# Patient Record
Sex: Female | Born: 1975 | Race: White | Hispanic: No | Marital: Married | Smoking: Former smoker
Health system: Southern US, Community
[De-identification: ages and names within clinical notes are randomized; demographics above are authoritative.]

## PROBLEM LIST (undated history)

## (undated) DIAGNOSIS — F419 Anxiety disorder, unspecified: Secondary | ICD-10-CM

## (undated) DIAGNOSIS — K219 Gastro-esophageal reflux disease without esophagitis: Secondary | ICD-10-CM

## (undated) DIAGNOSIS — F329 Major depressive disorder, single episode, unspecified: Secondary | ICD-10-CM

## (undated) DIAGNOSIS — N926 Irregular menstruation, unspecified: Secondary | ICD-10-CM

## (undated) DIAGNOSIS — R87629 Unspecified abnormal cytological findings in specimens from vagina: Secondary | ICD-10-CM

## (undated) DIAGNOSIS — K5900 Constipation, unspecified: Secondary | ICD-10-CM

## (undated) DIAGNOSIS — F32A Depression, unspecified: Secondary | ICD-10-CM

## (undated) HISTORY — DX: Unspecified abnormal cytological findings in specimens from vagina: R87.629

## (undated) HISTORY — DX: Anxiety disorder, unspecified: F41.9

## (undated) HISTORY — DX: Depression, unspecified: F32.A

## (undated) HISTORY — DX: Gastro-esophageal reflux disease without esophagitis: K21.9

## (undated) HISTORY — DX: Constipation, unspecified: K59.00

## (undated) HISTORY — DX: Irregular menstruation, unspecified: N92.6

## (undated) HISTORY — DX: Major depressive disorder, single episode, unspecified: F32.9

---

## 1998-07-02 HISTORY — PX: LEEP: SHX91

## 2014-01-07 LAB — HM PAP SMEAR: HM PAP: NEGATIVE

## 2015-03-17 ENCOUNTER — Telehealth: Payer: Self-pay | Admitting: Obstetrics and Gynecology

## 2015-03-17 NOTE — Telephone Encounter (Signed)
SHE IS ON LOR[place] THE BC AND SHE HAS SCHEDULED HER AE FOR NOV. CAN WE PLEASE CAL IN A RX FOR HER BC TO LAST UNTIL HER APPT WHICH IS 11/9, SHE HAS A PACK TO LAST HER UNTIL October SO SHE MIGHT WILL NEED ON OR MAYBE 2 REFILLS, [facility] [address]

## 2015-03-21 ENCOUNTER — Other Ambulatory Visit: Payer: Self-pay | Admitting: *Deleted

## 2015-03-21 MED ORDER — DROSPIRENONE-ETHINYL ESTRADIOL 3-0.02 MG PO TABS: 1.0000 | ORAL_TABLET | Freq: Every day | ORAL | Status: DC

## 2015-03-21 NOTE — Telephone Encounter (Signed)
Refilled x 2 pt is to keep appt that is scheduled

## 2015-04-21 ENCOUNTER — Encounter: Payer: Self-pay | Admitting: *Deleted

## 2015-05-04 ENCOUNTER — Other Ambulatory Visit: Payer: Self-pay | Admitting: *Deleted

## 2015-05-04 MED ORDER — DROSPIRENONE-ETHINYL ESTRADIOL 3-0.02 MG PO TABS: 1.0000 | ORAL_TABLET | Freq: Every day | ORAL | Status: AC

## 2015-05-11 ENCOUNTER — Encounter: Payer: Self-pay | Admitting: Obstetrics and Gynecology

## 2015-06-01 ENCOUNTER — Encounter: Payer: Self-pay | Admitting: Obstetrics and Gynecology

## 2015-06-01 ENCOUNTER — Ambulatory Visit (INDEPENDENT_AMBULATORY_CARE_PROVIDER_SITE_OTHER): Payer: Managed Care, Other (non HMO) | Admitting: Obstetrics and Gynecology

## 2015-06-01 ENCOUNTER — Other Ambulatory Visit: Payer: Self-pay | Admitting: Obstetrics and Gynecology

## 2015-06-01 VITALS — BP 134/85 | HR 96 | Ht 62.0 in | Wt 206.8 lb

## 2015-06-01 DIAGNOSIS — F529 Unspecified sexual dysfunction not due to a substance or known physiological condition: Secondary | ICD-10-CM

## 2015-06-01 DIAGNOSIS — F909 Attention-deficit hyperactivity disorder, unspecified type: Secondary | ICD-10-CM | POA: Diagnosis not present

## 2015-06-01 DIAGNOSIS — Z01419 Encounter for gynecological examination (general) (routine) without abnormal findings: Secondary | ICD-10-CM

## 2015-06-01 MED ORDER — ESCITALOPRAM OXALATE 20 MG PO TABS: 20.0000 mg | ORAL_TABLET | Freq: Every day | ORAL | Status: DC

## 2015-06-01 NOTE — Progress Notes (Signed)
Subjective:   Victoria MansonKerry Paule is a 39 y.o. 632P0 Caucasian female here for a routine well-woman exam.  Patient's last menstrual period was 05/06/2015 (exact date).    Current complaints: ADHD new diagnosis- doing well on Adderal, c/o absent sex drive and inability to achieve orgasm on Cymbalta,  PCP: Maryruth BunKapur       Does need & desire labs  Social History: Sexual: heterosexual Marital Status: married Living situation: with family Occupation: unknown occupation Tobacco/alcohol: no tobacco use Illicit drugs: no history of illicit drug use  The following portions of the patient's history were reviewed and updated as appropriate: allergies, current medications, past family history, past medical history, past social history, past surgical history and problem list.  Past Medical History Past Medical History  Diagnosis Date  . Anxiety   . Depression   . Vaginal Pap smear, abnormal   . Irregular menses   . Constipated     Past Surgical History Past Surgical History  Procedure Laterality Date  . Leep  2000    Gynecologic History G2P0  Patient's last menstrual period was 05/06/2015 (exact date). Contraception: OCP (estrogen/progesterone) Last Pap: 2014. Results were: normal  Obstetric History OB History  Gravida Para Term Preterm AB SAB TAB Ectopic Multiple Living  2         2    # Outcome Date GA Lbr Len/2nd Weight Sex Delivery Anes PTL Lv  2 Gravida 2013    M Vag-Spont   Y  1 Gravida 2012    M Vag-Spont   Y      Current Medications Current Outpatient Prescriptions on File Prior to Visit  Medication Sig Dispense Refill  . drospirenone-ethinyl estradiol (YAZ,GIANVI,LORYNA) 3-0.02 MG tablet Take 1 tablet by mouth daily. 3 Package 2   No current facility-administered medications on file prior to visit.    Review of Systems Patient denies any headaches, blurred vision, shortness of breath, chest pain, abdominal pain, problems with bowel movements, urination, or  intercourse.  Objective:  BP 134/85 mmHg  Pulse 96  Ht 5\' 2"  (1.575 m)  Wt 206 lb 12.8 oz (93.804 kg)  BMI 37.81 kg/m2  LMP 05/06/2015 (Exact Date) Physical Exam  General:  Well developed, well nourished, no acute distress. She is alert and oriented x3. Skin:  Warm and dry Neck:  Midline trachea, no thyromegaly or nodules Cardiovascular: Regular rate and rhythm, no murmur heard Lungs:  Effort normal, all lung fields clear to auscultation bilaterally Breasts:  No dominant palpable mass, retraction, or nipple discharge Abdomen:  Soft, non tender, no hepatosplenomegaly or masses Pelvic:  External genitalia is normal in appearance.  The vagina is normal in appearance. The cervix is bulbous, no CMT.  Thin prep pap is done with HR HPV cotesting. Uterus is felt to be normal size, shape, and contour.  No adnexal masses or tenderness noted. 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 F/U 1 year for AE, or sooner if needed Mammogram scheduled baseline  Treena Cosman Suzan NailerN Idil Maslanka, CNM

## 2015-06-01 NOTE — Patient Instructions (Addendum)
Place annual gynecologic exam patient instructions here.  Thank you for enrolling in MyChart. Please follow the instructions below to securely access your online medical record. MyChart allows you to send messages to your doctor, view your test results, manage appointments, and more.   How Do I Sign Up? 1. In your Internet browser, go to [employer] and enter [email or web address]. 2. Click on the Sign Up Now link in the Sign In box. You will see the New Member Sign Up page. 3. Enter your MyChart Access Code exactly as it appears below. You will not need to use this code after you've completed the sign-up process. If you do not sign up before the expiration date, you must request a new code.  MyChart Access Code: [email or web address] Expires: 07/31/2015  9:06 AM  4. Enter your Social Security Number ([id number]) and Date of Birth (mm/dd/yyyy) as indicated and click Submit. You will be taken to the next sign-up page. 5. Create a MyChart ID. This will be your MyChart login ID and cannot be changed, so think of one that is secure and easy to remember. 6. Create a MyChart password. You can change your password at any time. 7. Enter your Password Reset Question and Answer. This can be used at a later time if you forget your password.  8. Enter your e-mail address. You will receive e-mail notification when new information is available in MyChart. 9. Click Sign Up. You can now view your medical record.   Additional Information Remember, MyChart is NOT to be used for urgent needs. For medical emergencies, dial 911.   Levonorgestrel intrauterine device (IUD) What is this medicine? LEVONORGESTREL IUD (LEE voe nor jes trel) is a contraceptive (birth control) device. The device is placed inside the uterus by a healthcare professional. It is used to prevent pregnancy and can also be used to treat heavy bleeding that occurs during your period. Depending on the device, it can be used for 3 to 5  years. This medicine may be used for other purposes; ask your health care provider or pharmacist if you have questions. What should I tell my health care provider before I take this medicine? They need to know if you have any of these conditions: -abnormal Pap smear -cancer of the breast, uterus, or cervix -diabetes -endometritis -genital or pelvic infection now or in the past -have more than one sexual partner or your partner has more than one partner -heart disease -history of an ectopic or tubal pregnancy -immune system problems -IUD in place -liver disease or tumor -problems with blood clots or take blood-thinners -use intravenous drugs -uterus of unusual shape -vaginal bleeding that has not been explained -an unusual or allergic reaction to levonorgestrel, other hormones, silicone, or polyethylene, medicines, foods, dyes, or preservatives -pregnant or trying to get pregnant -breast-feeding How should I use this medicine? This device is placed inside the uterus by a health care professional. Talk to your pediatrician regarding the use of this medicine in children. Special care may be needed. Overdosage: If you think you have taken too much of this medicine contact a poison control center or emergency room at once. NOTE: This medicine is only for you. Do not share this medicine with others. What if I miss a dose? This does not apply. What may interact with this medicine? Do not take this medicine with any of the following medications: -amprenavir -bosentan -fosamprenavir This medicine may also interact with the following medications: -aprepitant -barbiturate medicines for inducing sleep or treating  seizures -bexarotene -griseofulvin -medicines to treat seizures like carbamazepine, ethotoin, felbamate, oxcarbazepine, phenytoin, topiramate -modafinil -pioglitazone -rifabutin -rifampin -rifapentine -some medicines to treat HIV infection like atazanavir, indinavir,  lopinavir, nelfinavir, tipranavir, ritonavir -St. John's wort -warfarin This list may not describe all possible interactions. Give your health care provider a list of all the medicines, herbs, non-prescription drugs, or dietary supplements you use. Also tell them if you smoke, drink alcohol, or use illegal drugs. Some items may interact with your medicine. What should I watch for while using this medicine? Visit your doctor or health care professional for regular check ups. See your doctor if you or your partner has sexual contact with others, becomes HIV positive, or gets a sexual transmitted disease. This product does not protect you against HIV infection (AIDS) or other sexually transmitted diseases. You can check the placement of the IUD yourself by reaching up to the top of your vagina with clean fingers to feel the threads. Do not pull on the threads. It is a good habit to check placement after each menstrual period. Call your doctor right away if you feel more of the IUD than just the threads or if you cannot feel the threads at all. The IUD may come out by itself. You may become pregnant if the device comes out. If you notice that the IUD has come out use a backup birth control method like condoms and call your health care provider. Using tampons will not change the position of the IUD and are okay to use during your period. What side effects may I notice from receiving this medicine? Side effects that you should report to your doctor or health care professional as soon as possible: -allergic reactions like skin rash, itching or hives, swelling of the face, lips, or tongue -fever, flu-like symptoms -genital sores -high blood pressure -no menstrual period for 6 weeks during use -pain, swelling, warmth in the leg -pelvic pain or tenderness -severe or sudden headache -signs of pregnancy -stomach cramping -sudden shortness of breath -trouble with balance, talking, or walking -unusual  vaginal bleeding, discharge -yellowing of the eyes or skin Side effects that usually do not require medical attention (report to your doctor or health care professional if they continue or are bothersome): -acne -breast pain -change in sex drive or performance -changes in weight -cramping, dizziness, or faintness while the device is being inserted -headache -irregular menstrual bleeding within first 3 to 6 months of use -nausea This list may not describe all possible side effects. Call your doctor for medical advice about side effects. You may report side effects to FDA at 1-800-FDA-1088. Where should I keep my medicine? This does not apply. NOTE: This sheet is a summary. It may not cover all possible information. If you have questions about this medicine, talk to your doctor, pharmacist, or health care provider.    2016, Elsevier/Gold Standard. (2011-07-19 13:54:04)

## 2015-06-02 LAB — COMPREHENSIVE METABOLIC PANEL
A/G RATIO: 1.5 (ref 1.1–2.5)
ALBUMIN: 4.1 g/dL (ref 3.5–5.5)
ALT: 12 IU/L (ref 0–32)
AST: 20 IU/L (ref 0–40)
Alkaline Phosphatase: 54 IU/L (ref 39–117)
BUN / CREAT RATIO: 16 (ref 8–20)
BUN: 11 mg/dL (ref 6–20)
Bilirubin Total: <0.2 mg/dL (ref 0.0–1.2)
CALCIUM: 9.6 mg/dL (ref 8.7–10.2)
CO2: 24 mmol/L (ref 18–29)
CREATININE: 0.68 mg/dL (ref 0.57–1.00)
Chloride: 99 mmol/L (ref 97–106)
GFR, EST AFRICAN AMERICAN: 128 mL/min/{1.73_m2} (ref >59)
GFR, EST NON AFRICAN AMERICAN: 111 mL/min/{1.73_m2} (ref >59)
GLOBULIN, TOTAL: 2.7 g/dL (ref 1.5–4.5)
Glucose: 94 mg/dL (ref 65–99)
POTASSIUM: 3.6 mmol/L (ref 3.5–5.2)
Sodium: 139 mmol/L (ref 136–144)
Total Protein: 6.8 g/dL (ref 6.0–8.5)

## 2015-06-02 LAB — THYROID PANEL WITH TSH
Free Thyroxine Index: 2 (ref 1.2–4.9)
T3 UPTAKE RATIO: 17 % — AB (ref 24–39)
T4, Total: 12 ug/dL (ref 4.5–12.0)
TSH: 4.84 u[IU]/mL — ABNORMAL HIGH (ref 0.450–4.500)

## 2015-06-02 LAB — LIPID PANEL
CHOL/HDL RATIO: 2.3 ratio (ref 0.0–4.4)
Cholesterol, Total: 187 mg/dL (ref 100–199)
HDL: 81 mg/dL (ref >39)
LDL CALC: 64 mg/dL (ref 0–99)
Triglycerides: 208 mg/dL — ABNORMAL HIGH (ref 0–149)
VLDL Cholesterol Cal: 42 mg/dL — ABNORMAL HIGH (ref 5–40)

## 2015-06-02 LAB — CYTOLOGY - PAP

## 2015-06-02 LAB — HEMOGLOBIN A1C
ESTIMATED AVERAGE GLUCOSE: 111 mg/dL
HEMOGLOBIN A1C: 5.5 % (ref 4.8–5.6)

## 2015-06-03 ENCOUNTER — Other Ambulatory Visit: Payer: Self-pay | Admitting: Obstetrics and Gynecology

## 2015-06-03 DIAGNOSIS — R7989 Other specified abnormal findings of blood chemistry: Secondary | ICD-10-CM | POA: Insufficient documentation

## 2015-06-03 DIAGNOSIS — E782 Mixed hyperlipidemia: Secondary | ICD-10-CM | POA: Insufficient documentation

## 2015-06-09 ENCOUNTER — Encounter: Payer: Self-pay | Admitting: Obstetrics and Gynecology

## 2015-06-09 ENCOUNTER — Ambulatory Visit
Admission: RE | Admit: 2015-06-09 | Discharge: 2015-06-09 | Disposition: A | Discharge status: Home / Self Care | Payer: Managed Care, Other (non HMO) | Source: Ambulatory Visit | Attending: Obstetrics and Gynecology | Admitting: Obstetrics and Gynecology

## 2015-06-09 DIAGNOSIS — Z01419 Encounter for gynecological examination (general) (routine) without abnormal findings: Secondary | ICD-10-CM

## 2015-06-09 DIAGNOSIS — Z1231 Encounter for screening mammogram for malignant neoplasm of breast: Secondary | ICD-10-CM | POA: Diagnosis not present

## 2015-07-15 ENCOUNTER — Ambulatory Visit: Payer: Managed Care, Other (non HMO) | Admitting: Obstetrics and Gynecology

## 2015-08-02 ENCOUNTER — Encounter: Payer: Self-pay | Admitting: Obstetrics and Gynecology

## 2015-08-02 ENCOUNTER — Ambulatory Visit (INDEPENDENT_AMBULATORY_CARE_PROVIDER_SITE_OTHER): Payer: Managed Care, Other (non HMO) | Admitting: Obstetrics and Gynecology

## 2015-08-02 VITALS — BP 125/72 | HR 98 | Ht 62.0 in | Wt 209.2 lb

## 2015-08-02 DIAGNOSIS — R7989 Other specified abnormal findings of blood chemistry: Secondary | ICD-10-CM

## 2015-08-02 MED ORDER — LEVONORGESTREL 20 MCG/24HR IU IUD: 1.0000 | INTRAUTERINE_SYSTEM | Freq: Once | INTRAUTERINE | Status: AC

## 2015-08-02 NOTE — Progress Notes (Signed)
Victoria Gonzalez is a 40 y.o. year old G64P0 Caucasian female who presents for placement of a Mirena IUD.  Patient's last menstrual period was 07/31/2015. BP 125/72 mmHg  Pulse 98  Ht  (1.575 m)  Wt 209 lb 3.2 oz (94.892 kg)  BMI 38.25 kg/m2  LMP 07/31/2015 Last sexual intercourse was ?, and pregnancy test today was negative  The risks and benefits of the method and placement have been thouroughly reviewed with the patient and all questions were answered.  Specifically the patient is aware of failure rate of 07/998, expulsion of the IUD and of possible perforation.  The patient is aware of irregular bleeding due to the method and understands the incidence of irregular bleeding diminishes with time.  Signed copy of informed consent in chart.   Time out was performed.  A graves speculum was placed in the vagina.  The cervix was visualized, prepped using Betadine, and grasped with a single tooth tenaculum. The uterus was found to be anteroflexed and it sounded to 7 cm.  Mirena IUD placed per manufacturer's recommendations.   The strings were trimmed to 3 cm.   The patient was given post procedure instructions, including signs and symptoms of infection and to check for the strings after each menses or each month, and refraining from intercourse or anything in the vagina for 3 days.  She was given a Mirena care card with date Mirena placed, and date Mirena to be removed.    Aizley Stenseth Suzan Nailer, CNM

## 2015-08-02 NOTE — Patient Instructions (Signed)
IUD PLACEMENT POST-PROCEDURE INSTRUCTIONS  1. You may take Ibuprofen, Aleve or Tylenol for pain if needed.  Cramping should resolve within in 24 hours.  2. You may have a small amount of spotting.  You should wear a mini pad for the next few days.  3. You may have intercourse after 24 hours.  If you using this for birth control, it is effective immediately.  4. You need to call if you have any pelvic pain, fever, heavy bleeding or foul smelling vaginal discharge.  Irregular bleeding is common the first several months after having an IUD placed. You do not need to call for this reason unless you are concerned.  5. Shower or bathe as normal   

## 2015-08-03 LAB — THYROID PANEL WITH TSH
Free Thyroxine Index: 2 (ref 1.2–4.9)
T3 UPTAKE RATIO: 19 % — AB (ref 24–39)
T4 TOTAL: 10.7 ug/dL (ref 4.5–12.0)
TSH: 2.43 u[IU]/mL (ref 0.450–4.500)

## 2015-12-29 ENCOUNTER — Other Ambulatory Visit: Payer: Self-pay

## 2015-12-30 ENCOUNTER — Other Ambulatory Visit: Payer: Self-pay

## 2015-12-30 ENCOUNTER — Telehealth: Payer: Self-pay | Admitting: *Deleted

## 2015-12-30 ENCOUNTER — Other Ambulatory Visit: Payer: Self-pay | Admitting: *Deleted

## 2015-12-30 MED ORDER — ESCITALOPRAM OXALATE 20 MG PO TABS: 20.0000 mg | ORAL_TABLET | Freq: Every day | ORAL | Status: AC

## 2015-12-30 MED ORDER — ESCITALOPRAM OXALATE 20 MG PO TABS
20.0000 mg | ORAL_TABLET | Freq: Every day | ORAL | Status: DC
Start: 2015-12-30 — End: 2015-12-30

## 2015-12-30 NOTE — Telephone Encounter (Signed)
Patient called and states she needs a refill of her Lexapro. Patient is requesting a 3 mth supply [facility] [place] [phone]( [address] [facility] [place]  The pharmacy number is [phone]. Thanks

## 2015-12-30 NOTE — Telephone Encounter (Signed)
Done-ac 

## 2016-05-11 IMAGING — MG MM DIGITAL SCREENING BILATERAL
6 series · 6 of 6 positions shown · non-contrast
Comparison: None.

CLINICAL DATA: Screening. Baseline screening mammogram.

EXAM:
DIGITAL SCREENING BILATERAL MAMMOGRAM WITH CAD

[R CV]
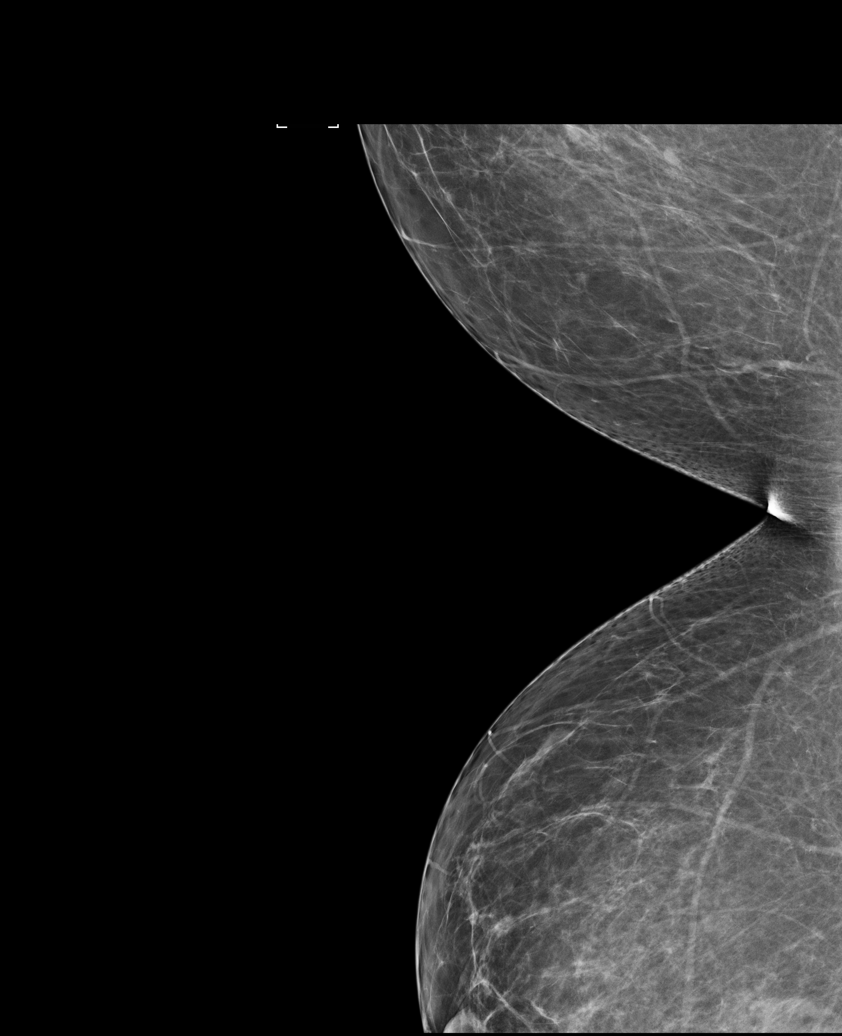

[R CC]
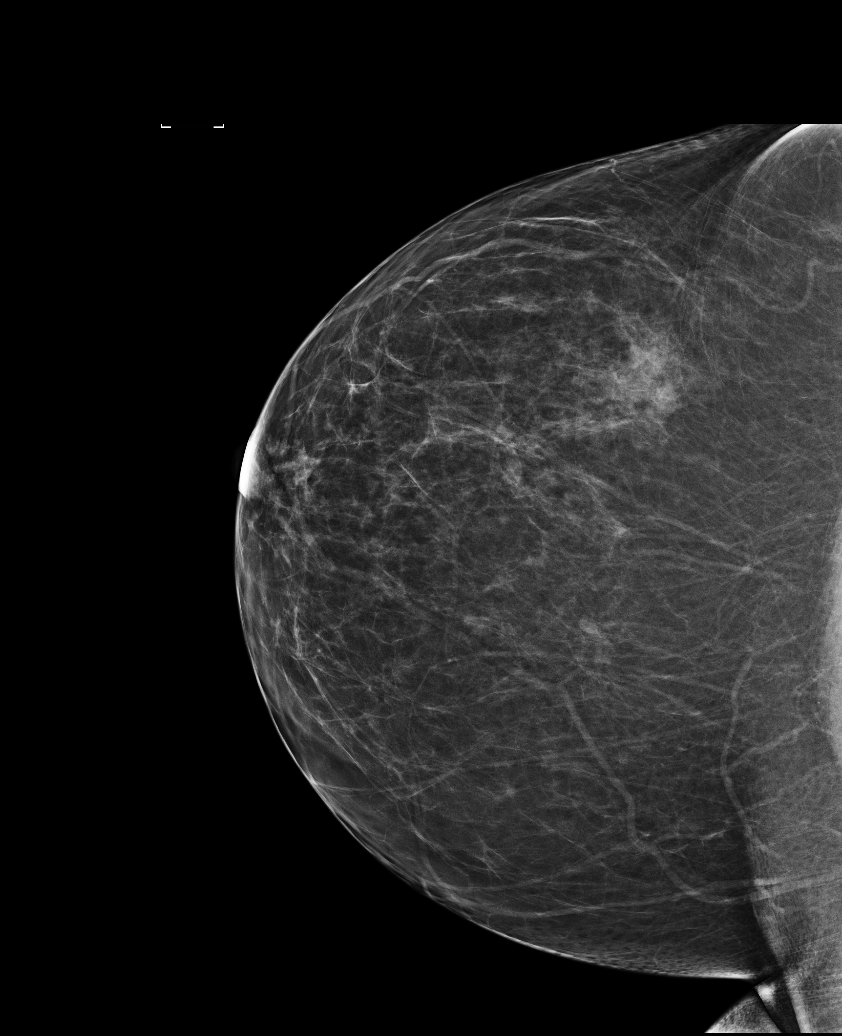

[L MLO (1 of 2)]
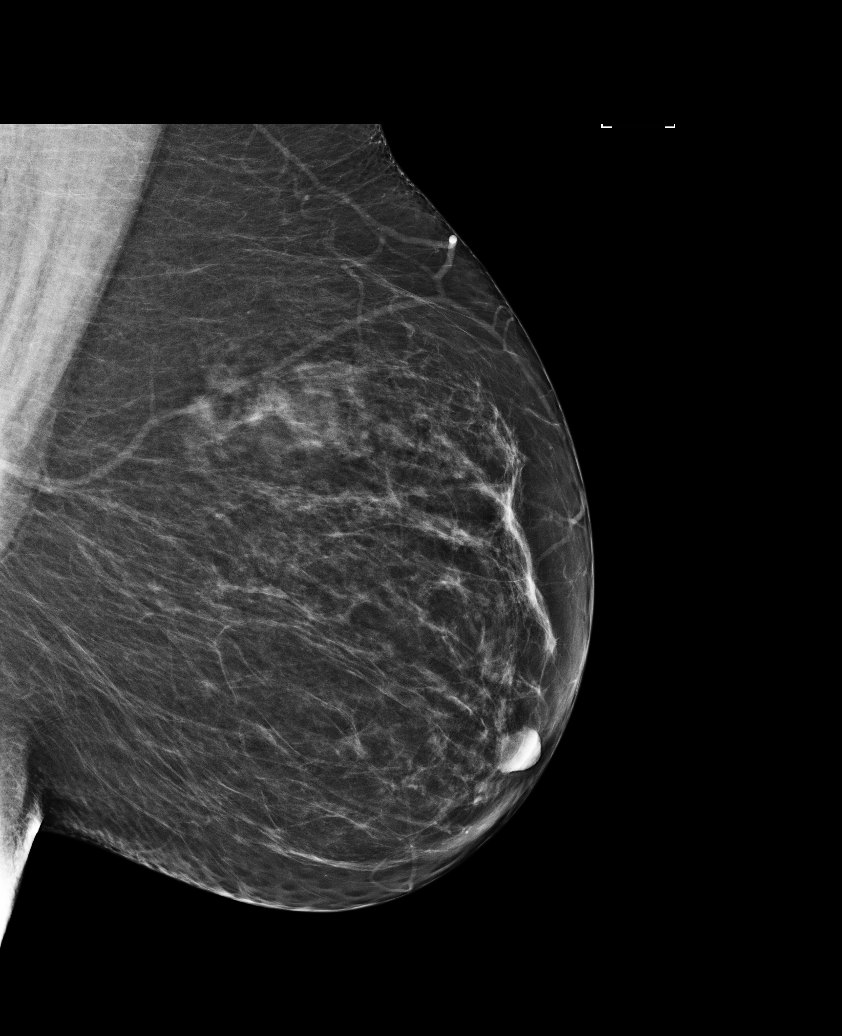

[R MLO]
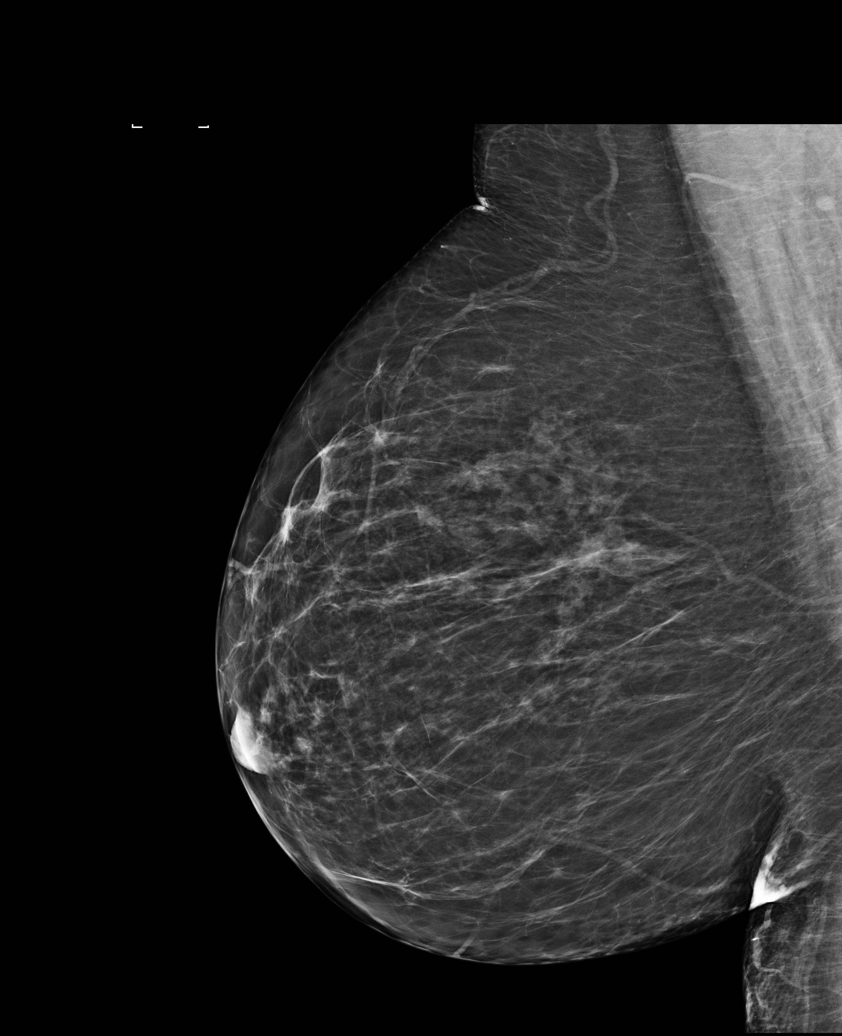

[L CC]
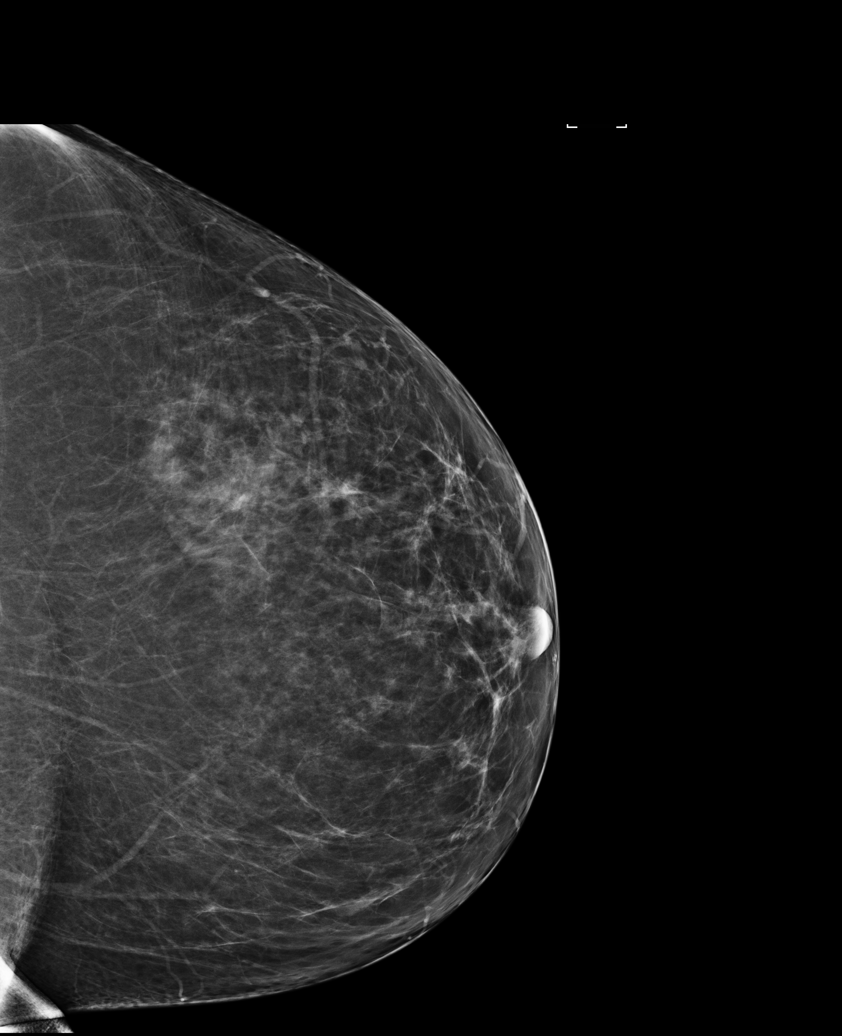

[L MLO (2 of 2)]
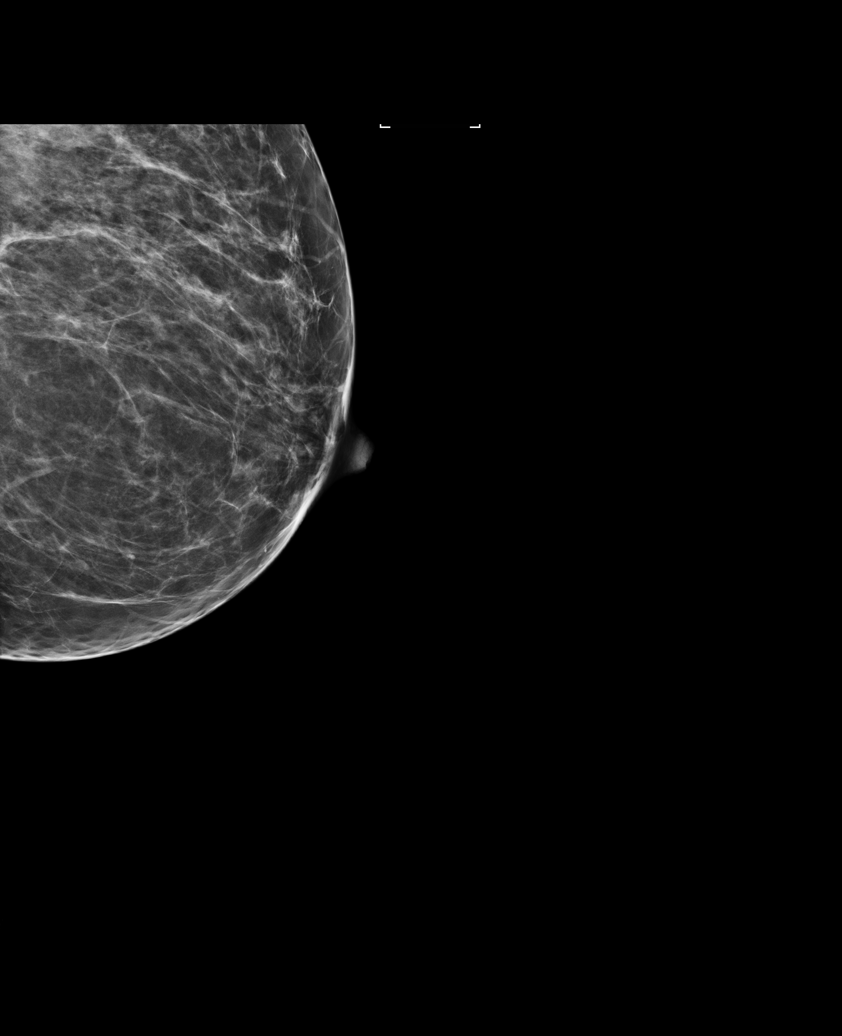

[6 of 6 positions shown; findings below may reference images not displayed]

ACR Breast Density Category b: There are scattered areas of
fibroglandular density.
FINDINGS: There are no findings suspicious for malignancy. Images were
processed with CAD.
IMPRESSION: No mammographic evidence of malignancy. A result letter of this
screening mammogram will be mailed directly to the patient.

RECOMMENDATION:
Screening mammogram at age 41. (Code:DZ-G-URH)

BI-RADS CATEGORY  1: Negative.

## 2016-06-01 ENCOUNTER — Encounter: Payer: Managed Care, Other (non HMO) | Admitting: Obstetrics and Gynecology

## 2016-06-11 ENCOUNTER — Encounter: Payer: Self-pay | Admitting: Obstetrics and Gynecology

## 2016-06-12 ENCOUNTER — Encounter: Payer: Managed Care, Other (non HMO) | Admitting: Obstetrics and Gynecology
